# Patient Record
Sex: Male | Born: 1970
Health system: Southern US, Community
[De-identification: ages and names within clinical notes are randomized; demographics above are authoritative.]

---

## 2008-04-08 ENCOUNTER — Emergency Department (HOSPITAL_COMMUNITY): Admission: EM | Admit: 2008-04-08 | Discharge: 2008-04-08 | Payer: Self-pay | Admitting: Emergency Medicine

## 2009-05-08 ENCOUNTER — Emergency Department (HOSPITAL_COMMUNITY): Admission: EM | Admit: 2009-05-08 | Discharge: 2009-05-08 | Payer: Self-pay | Admitting: Family Medicine

## 2009-06-11 ENCOUNTER — Emergency Department (HOSPITAL_COMMUNITY): Admission: EM | Admit: 2009-06-11 | Discharge: 2009-06-11 | Payer: Self-pay | Admitting: Emergency Medicine

## 2010-12-19 LAB — POCT I-STAT, CHEM 8
Calcium, Ion: 1.2 mmol/L (ref 1.12–1.32)
HCT: 47 % (ref 39.0–52.0)
Hemoglobin: 16 g/dL (ref 13.0–17.0)
TCO2: 28 mmol/L (ref 0–100)

## 2010-12-19 LAB — PROTIME-INR: Prothrombin Time: 12.8 seconds (ref 11.6–15.2)

## 2010-12-20 LAB — CULTURE, ROUTINE-ABSCESS

## 2020-09-03 ENCOUNTER — Other Ambulatory Visit: Payer: Self-pay

## 2020-09-03 ENCOUNTER — Emergency Department (HOSPITAL_COMMUNITY)
Admission: EM | Admit: 2020-09-03 | Discharge: 2020-09-04 | Disposition: A | Discharge status: Home / Self Care | Payer: Self-pay | Attending: Emergency Medicine | Admitting: Emergency Medicine

## 2020-09-03 DIAGNOSIS — Y9339 Activity, other involving climbing, rappelling and jumping off: Secondary | ICD-10-CM | POA: Insufficient documentation

## 2020-09-03 DIAGNOSIS — S62327A Displaced fracture of shaft of fifth metacarpal bone, left hand, initial encounter for closed fracture: Secondary | ICD-10-CM | POA: Insufficient documentation

## 2020-09-03 DIAGNOSIS — W268XXA Contact with other sharp object(s), not elsewhere classified, initial encounter: Secondary | ICD-10-CM | POA: Insufficient documentation

## 2020-09-03 DIAGNOSIS — Z23 Encounter for immunization: Secondary | ICD-10-CM | POA: Insufficient documentation

## 2020-09-03 DIAGNOSIS — S51812A Laceration without foreign body of left forearm, initial encounter: Secondary | ICD-10-CM | POA: Insufficient documentation

## 2020-09-03 NOTE — ED Triage Notes (Signed)
Pt with multiple lacs to left forearm, L elbow from attempting to jump over a fence 12 hours ago. Last tetanus unknown.

## 2020-09-04 ENCOUNTER — Other Ambulatory Visit (HOSPITAL_COMMUNITY): Payer: Self-pay | Admitting: Physician Assistant

## 2020-09-04 ENCOUNTER — Emergency Department (HOSPITAL_COMMUNITY): Payer: Self-pay

## 2020-09-04 MED ORDER — CEPHALEXIN 500 MG PO CAPS: 500.0000 mg | ORAL_CAPSULE | Freq: Four times a day (QID) | ORAL | 0 refills | Status: DC

## 2020-09-04 MED ORDER — CEFAZOLIN SODIUM-DEXTROSE 2-4 GM/100ML-% IV SOLN
2.0000 g | Freq: Once | INTRAVENOUS | Status: AC
  Administered 2020-09-04: 2 g via INTRAVENOUS
  Filled 2020-09-04: qty 100

## 2020-09-04 MED ORDER — TETANUS-DIPHTH-ACELL PERTUSSIS 5-2.5-18.5 LF-MCG/0.5 IM SUSY
0.5000 mL | PREFILLED_SYRINGE | Freq: Once | INTRAMUSCULAR | Status: AC
  Administered 2020-09-04: 0.5 mL via INTRAMUSCULAR
  Filled 2020-09-04: qty 0.5

## 2020-09-04 MED ORDER — LIDOCAINE-EPINEPHRINE 1 %-1:100000 IJ SOLN
10.0000 mL | Freq: Once | INTRAMUSCULAR | Status: DC
  Filled 2020-09-04: qty 1

## 2020-09-04 MED FILL — CEPHALEXIN 500 MG CAPS: 500 | 10 days supply | Qty: 40 | Fill #0

## 2020-09-04 NOTE — Consult Note (Signed)
Reason for Consult:Left 5th MC fx Referring Physician: Ruel Favors Time called: 1313 Time at bedside: 1326   Bill Ryan is an 49 y.o. male.  HPI: Haywood was jumping over a chain link fence night before last. He cut his left forearm and hurt his hand. He came to the ED for evaluation and x-rays showed a 5th MC fx and hand surgery was consulted. He is homeless and currently unemployed. He is RHD.  No past medical history on file.  No family history on file.  Social History:  has no history on file for tobacco use, alcohol use, and drug use.  Allergies: No Known Allergies  Medications: I have reviewed the patient's current medications.  No results found for this or any previous visit (from the past 48 hour(s)).  DG Forearm Left  Result Date: 09/04/2020 CLINICAL DATA:  Multiple forearm lacerations after attempting to jump over a fence 12 hours ago. EXAM: LEFT FOREARM - 2 VIEW COMPARISON:  None. FINDINGS: There is no evidence of fracture or other focal bone lesions. Mild soft tissue irregularity along the ulnar aspect of the distal forearm. IMPRESSION: 1. Mild soft tissue irregularity along the ulnar aspect of the distal forearm. No acute osseous abnormality. Electronically Signed   By: Obie Dredge M.D.   On: 09/04/2020 13:02   DG Hand Complete Left  Result Date: 09/04/2020 CLINICAL DATA:  Jumped over a fence 12 hours ago. Fifth metacarpal deformity. EXAM: LEFT HAND - COMPLETE 3+ VIEW COMPARISON:  None. FINDINGS: Acute impacted fracture of the distal fifth metacarpal shaft with volar angulation. Overlying soft tissue swelling. No additional fracture. No dislocation. Joint spaces are preserved. Bone mineralization is normal. IMPRESSION: 1. Acute angulated and impacted fracture of the distal fifth metacarpal. Electronically Signed   By: Obie Dredge M.D.   On: 09/04/2020 13:04    Review of Systems  Constitutional: Negative for chills, diaphoresis and fever.  HENT: Negative for  ear discharge, ear pain, hearing loss and tinnitus.   Eyes: Negative for photophobia and pain.  Respiratory: Negative for cough and shortness of breath.   Cardiovascular: Negative for chest pain.  Gastrointestinal: Negative for abdominal pain, nausea and vomiting.  Genitourinary: Negative for dysuria, flank pain, frequency and urgency.  Musculoskeletal: Positive for arthralgias (Left hand/FA). Negative for back pain, myalgias and neck pain.  Neurological: Negative for dizziness and headaches.  Hematological: Does not bruise/bleed easily.  Psychiatric/Behavioral: The patient is not nervous/anxious.    Blood pressure 113/80, pulse 78, temperature 98.2 F (36.8 C), temperature source Oral, resp. rate 14, height 5\' 9"  (1.753 m), weight 74.4 kg, SpO2 98 %. Physical Exam Constitutional:      General: He is not in acute distress.    Appearance: He is well-developed and well-nourished. He is not diaphoretic.  HENT:     Head: Normocephalic and atraumatic.  Eyes:     General: No scleral icterus.       Right eye: No discharge.        Left eye: No discharge.     Conjunctiva/sclera: Conjunctivae normal.  Cardiovascular:     Rate and Rhythm: Normal rate and regular rhythm.  Pulmonary:     Effort: Pulmonary effort is normal. No respiratory distress.  Musculoskeletal:     Cervical back: Normal range of motion.     Comments: Left shoulder, elbow, wrist, digits- superficial lacerations volar FA, mod TTP 5th distal MC, no instability, no blocks to motion  Sens  Ax/R/M/U intact  Mot   Ax/  R/ PIN/ M/ AIN/ U intact  Rad 2+  Skin:    General: Skin is warm and dry.  Neurological:     Mental Status: He is alert.  Psychiatric:        Mood and Affect: Mood and affect normal.        Behavior: Behavior normal.     Assessment/Plan: Left 5th MC fx -- Plan buddy tape, removable volar splint and f/u with Dr. Janee Morn. Advise leaving wounds open for drainage and wound care.    Freeman Caldron,  PA-C Orthopedic Surgery [phone] 09/04/2020, 1:43 PM

## 2020-09-04 NOTE — Discharge Planning (Signed)
RNCM consulted regarding medication assistance of pt unable to afford medications.  RNCM utilized [facility] petty cash to cover $6 co-pay and filled through [facility].  Rx e-scribed to [facility] and delivered to pt prior to discharge home.  No further RNCM needs identified at this time.

## 2020-09-04 NOTE — Discharge Instructions (Addendum)
Like we discussed, I am prescribing you an antibiotic called Keflex.  You are going to take this 4 times a day for the next 10 days.  Do not stop taking this early.  Dr. Janee Morn with hand surgery is going to contact you to make an appointment for follow-up in 10 to 15 days.  Please make sure you go to this appointment.  If your symptoms worsen, you need to return to the emergency department for reevaluation.  Please also make sure you are keeping your wound clean and dry.  Continue to clean it 2 times a day with soap and water. Remove your splint while you are bathing but otherwise wear your splint.

## 2020-09-04 NOTE — ED Provider Notes (Signed)
[name] EMERGENCY DEPARTMENT Provider Note   CSN: [id number] Arrival date & time: 09/03/20  1734     History Chief Complaint  Patient presents with  . Laceration    Bill Ryan is a 49 y.o. male.  HPI   Patient is a 49 year old male who presents to the emergency department due to multiple lacerations to left forearm.  Patient states he was jumping over a fence and his forearm was caught on the metal barbs of the fence.  Unsure of the timing of his last tetanus.  Notes moderate pain in the region.  Also reports pain and swelling just proximal to the left fifth MCP.  No head trauma or LOC.  No numbness or weakness.     No past medical history on file.  There are no problems to display for this patient.    No family history on file.     Home Medications Prior to Admission medications   Not on File    Allergies    Patient has no known allergies.  Review of Systems   Review of Systems  Musculoskeletal: Positive for myalgias.  Skin: Positive for wound. Negative for color change.  Neurological: Negative for weakness and numbness.   Physical Exam Updated Vital Signs BP 107/82 (BP Location: Left Arm)   Pulse 94   Temp 98.2 F (36.8 C) (Oral)   Resp 16   Ht 5\' 9"  (1.753 m)   Wt 74.4 kg   SpO2 100%   BMI 24.22 kg/m   Physical Exam Vitals and nursing note reviewed.  Constitutional:      General: He is not in acute distress.    Appearance: He is well-developed.  HENT:     Head: Normocephalic and atraumatic.     Right Ear: External ear normal.     Left Ear: External ear normal.  Eyes:     General: No scleral icterus.       Right eye: No discharge.        Left eye: No discharge.     Conjunctiva/sclera: Conjunctivae normal.  Neck:     Trachea: No tracheal deviation.  Cardiovascular:     Rate and Rhythm: Normal rate.  Pulmonary:     Effort: Pulmonary effort is normal. No respiratory distress.     Breath sounds: No stridor.   Abdominal:     General: There is no distension.  Musculoskeletal:        General: Tenderness, deformity and signs of injury present. No swelling.     Cervical back: Neck supple.     Comments: Deformity and point of moderate tenderness and swelling just proximal to the left fifth MCP.  Full range of motion of all the fingers of the left hand.  Skin:    General: Skin is warm and dry.     Findings: No rash.     Comments: 2 moderate avulsion lacerations noted to the left forearm.  No active bleeding. Distal sensation intact.  Full range of motion of the left elbow, left wrist, as well as all 5 fingers of the left hand.  Neurological:     Mental Status: He is alert.     Cranial Nerves: Cranial nerve deficit: no gross deficits.    ED Results / Procedures / Treatments   Labs (all labs ordered are listed, but only abnormal results are displayed) Labs Reviewed - No data to display  EKG None  Radiology DG Forearm Left  Result Date: 09/04/2020 CLINICAL DATA:  Multiple forearm lacerations after attempting to jump over a fence 12 hours ago. EXAM: LEFT FOREARM - 2 VIEW COMPARISON:  None. FINDINGS: There is no evidence of fracture or other focal bone lesions. Mild soft tissue irregularity along the ulnar aspect of the distal forearm. IMPRESSION: 1. Mild soft tissue irregularity along the ulnar aspect of the distal forearm. No acute osseous abnormality. Electronically Signed   By: Obie Dredge M.D.   On: 09/04/2020 13:02   DG Hand Complete Left  Result Date: 09/04/2020 CLINICAL DATA:  Jumped over a fence 12 hours ago. Fifth metacarpal deformity. EXAM: LEFT HAND - COMPLETE 3+ VIEW COMPARISON:  None. FINDINGS: Acute impacted fracture of the distal fifth metacarpal shaft with volar angulation. Overlying soft tissue swelling. No additional fracture. No dislocation. Joint spaces are preserved. Bone mineralization is normal. IMPRESSION: 1. Acute angulated and impacted fracture of the distal fifth  metacarpal. Electronically Signed   By: Obie Dredge M.D.   On: 09/04/2020 13:04    Procedures Procedures (including critical care time)  Medications Ordered in ED Medications  lidocaine-EPINEPHrine (XYLOCAINE W/EPI) 1 %-1:100000 (with pres) injection 10 mL (10 mLs Infiltration Handoff 09/04/20 1330)  Tdap (BOOSTRIX) injection 0.5 mL (0.5 mLs Intramuscular Given 09/04/20 1404)  ceFAZolin (ANCEF) IVPB 2g/100 mL premix (2 g Intravenous New Bag/Given 09/04/20 1428)   ED Course  I have reviewed the triage vital signs and the nursing notes.  Pertinent labs & imaging results that were available during my care of the patient were reviewed by me and considered in my medical decision making (see chart for details).  Clinical Course as of 09/04/20 1532  Wed Sep 04, 2020  1340 Patient discussed with and evaluated by Charma Igo PA-C.  Patient also discussed with Dr. Janee Morn.  Does not recommend surgery at this time.  Recommends 2 g of Ancef, removable volar splint, cleaning wounds on the forearm and not suturing, as well as buddy taping the fourth and fifth digits together of the left hand.  Patient is right-hand dominant.  Dr. Carollee Massed office will reach out to the patient for follow-up.  Patient is amenable with this plan. [LJ]    Clinical Course User Index [LJ] Placido Sou, PA-C   MDM Rules/Calculators/A&P                          Patient is a 49 year old male who presents to the emergency department with multiple avulsion lacerations to the left forearm as well as a acute angulated and impacted fracture of the distal fifth metacarpal.  Patient discussed with and evaluated by Charma Igo, PA-C.  He then discussed patient with Dr. Janee Morn, who is on-call for hand surgery.  Dr. Janee Morn does not recommend surgery at this time.  Requested that we give patient 2 g of IV Ancef, place him in a removable volar splint, as well as buddy tape the fourth and fifth digits of the left  hand.  He is right-hand dominant.  Given the length of time since his lacerations occurred, did not recommend moving forward with sutures on the forearm.  The wounds were cleaned extensively and bandaged.  Tdap updated in the ED.  Patient states that he experiences intermittent homelessness and currently has no funds for antibiotics.  He was discussed with TOC who provided him with 10 days of Keflex 4 times daily.    Dr. Janee Morn is going to reach out to the patient in 10 to 15 days for follow-up.  This  was discussed with patient in length and he is amenable with this plan.  We discussed wound care in length.  His questions were answered and he was amicable at the time of discharge.  Final Clinical Impression(s) / ED Diagnoses Final diagnoses:  Laceration of left forearm, initial encounter  Closed displaced fracture of shaft of fifth metacarpal bone of left hand, initial encounter    Rx / DC Orders ED Discharge Orders         Ordered    cephALEXin (KEFLEX) 500 MG capsule  4 times daily        09/04/20 1246           Placido Sou, PA-C 09/04/20 1533    Benjiman Core, MD 09/04/20 1620

## 2020-09-04 NOTE — Progress Notes (Signed)
Orthopedic Tech Progress Note Patient Details:  Bill Ryan 03-17-1971 [id number]  Ortho Devices Type of Ortho Device: Velcro wrist splint Ortho Device/Splint Location: LUE Ortho Device/Splint Interventions: Ordered,Application   Post Interventions Patient Tolerated: Well Instructions Provided: Care of device   [name] 09/04/2020, 4:15 PM

## 2020-09-04 NOTE — Progress Notes (Signed)
CSW added shelter resources to the patient's AVS for reference.  Edwin Dada, MSW, LCSW-A Transitions of Care  Clinical Social Worker [facility] Emergency Departments  Medical ICU [phone]

## 2020-09-04 NOTE — ED Notes (Addendum)
Ortho notified

## 2020-09-04 NOTE — ED Notes (Signed)
Pt given the antibiotics provided by this hospital.

## 2020-09-16 ENCOUNTER — Other Ambulatory Visit: Payer: Self-pay

## 2020-10-17 ENCOUNTER — Emergency Department (HOSPITAL_COMMUNITY)
Admission: EM | Admit: 2020-10-17 | Discharge: 2020-10-18 | Disposition: A | Discharge status: Home / Self Care | Payer: Self-pay | Attending: Emergency Medicine | Admitting: Emergency Medicine

## 2020-10-17 DIAGNOSIS — X501XXA Overexertion from prolonged static or awkward postures, initial encounter: Secondary | ICD-10-CM | POA: Insufficient documentation

## 2020-10-17 DIAGNOSIS — M25561 Pain in right knee: Secondary | ICD-10-CM | POA: Insufficient documentation

## 2020-10-17 DIAGNOSIS — M25572 Pain in left ankle and joints of left foot: Secondary | ICD-10-CM | POA: Insufficient documentation

## 2020-10-17 DIAGNOSIS — Y9302 Activity, running: Secondary | ICD-10-CM | POA: Insufficient documentation

## 2020-10-17 DIAGNOSIS — F17200 Nicotine dependence, unspecified, uncomplicated: Secondary | ICD-10-CM | POA: Insufficient documentation

## 2020-10-18 ENCOUNTER — Encounter (HOSPITAL_COMMUNITY): Payer: Self-pay | Admitting: Emergency Medicine

## 2020-10-18 ENCOUNTER — Emergency Department (HOSPITAL_COMMUNITY): Payer: Self-pay

## 2020-10-18 ENCOUNTER — Other Ambulatory Visit: Payer: Self-pay

## 2020-10-18 MED ORDER — NAPROXEN 250 MG PO TABS
500.0000 mg | ORAL_TABLET | Freq: Once | ORAL | Status: AC
  Administered 2020-10-18: 500 mg via ORAL
  Filled 2020-10-18: qty 2

## 2020-10-18 NOTE — Progress Notes (Signed)
Orthopedic Tech Progress Note Patient Details:  Bill Ryan [birth year] [id number]  Ortho Devices Type of Ortho Device: ASO Ortho Device/Splint Location: Left Ankle Ortho Device/Splint Interventions: Application   Post Interventions Patient Tolerated: Bill Ryan 10/18/2020, 1:28 AM

## 2020-10-18 NOTE — Discharge Instructions (Signed)
Apply ice to areas of injury 3-4 times per day to limit inflammation. Take 600mg  ibuprofen every 6 hours for pain and swelling. Wear a brace for stability. We recommend follow-up with a primary care doctor to ensure resolution of symptoms.  Return to the ED for any new or concerning symptoms.

## 2020-10-18 NOTE — ED Triage Notes (Signed)
Patient arrived with 2 [employer] officers handcuffed reports pain at right knee and left ankle injured while he was being arrested this evening , ambulatory /respirations unlabored , alert and oriented .

## 2020-10-18 NOTE — ED Provider Notes (Signed)
[facility] EMERGENCY DEPARTMENT Provider Note   CSN: [id number] Arrival date & time: 10/17/20  2347     History Chief Complaint  Patient presents with  . Knee Pain / Ankle Pain     [employer] handcuffed    Bill Ryan is a 50 y.o. male.  50 year old male presents to the emergency department with 2 [employer] officers.  Reports pain in his left ankle and right knee which occurred while evading the police.  Pain has remained unchanged.  States that he twisted his left ankle while running.  Has been having constant pain which is aggravated with weightbearing.  He has not had any inability to ambulate.  No medications taken prior to arrival for pain.  Denies numbness or prior injury to the extremities.       History reviewed. No pertinent past medical history.  There are no problems to display for this patient.   History reviewed. No pertinent surgical history.     No family history on file.  Social History   Tobacco Use  . Smoking status: Current Every Day Smoker  . Smokeless tobacco: Never Used  Substance Use Topics  . Alcohol use: Never  . Drug use: Never    Home Medications Prior to Admission medications   Not on File    Allergies    Patient has no known allergies.  Review of Systems   Review of Systems  Ten systems reviewed and are negative for acute change, except as noted in the HPI.    Physical Exam Updated Vital Signs BP 121/86 (BP Location: Right Arm)   Pulse 100   Temp 98.9 F (37.2 C) (Oral)   Resp 16   Ht 5\' 9"  (1.753 m)   Wt 82 kg   SpO2 97%   BMI 26.70 kg/m   Physical Exam Vitals and nursing note reviewed.  Constitutional:      General: He is not in acute distress.    Appearance: He is well-developed and well-nourished. He is not diaphoretic.     Comments: Nontoxic appearing and in NAD  HENT:     Head: Normocephalic and atraumatic.  Eyes:     General: No scleral icterus.    Extraocular Movements: EOM normal.      Conjunctiva/sclera: Conjunctivae normal.  Cardiovascular:     Rate and Rhythm: Normal rate and regular rhythm.     Pulses: Normal pulses.     Comments: DP pulse 2+ b/l Pulmonary:     Effort: Pulmonary effort is normal. No respiratory distress.  Musculoskeletal:        General: Normal range of motion.     Cervical back: Normal range of motion.     Right knee: No swelling, deformity, erythema or bony tenderness. Normal range of motion. Normal alignment.     Left ankle: No swelling or deformity. Normal range of motion.     Left Achilles Tendon: Tenderness present. Thompson's test negative.  Skin:    General: Skin is warm and dry.     Coloration: Skin is not pale.     Findings: No erythema or rash.  Neurological:     Mental Status: He is alert and oriented to person, place, and time.     Coordination: Coordination normal.     Comments: Sensation to light touch intact in all extremities. Ambulatory with steady gait without assistance.  Psychiatric:        Mood and Affect: Mood and affect normal.  Behavior: Behavior normal.     ED Results / Procedures / Treatments   Labs (all labs ordered are listed, but only abnormal results are displayed) Labs Reviewed - No data to display  EKG None  Radiology DG Ankle Complete Left  Result Date: 10/18/2020 CLINICAL DATA:  Injury and pain EXAM: LEFT ANKLE COMPLETE - 3+ VIEW COMPARISON:  None. FINDINGS: There is no evidence of fracture, dislocation, or joint effusion. There is no evidence of arthropathy or other focal bone abnormality. Soft tissues are unremarkable. IMPRESSION: Negative. Electronically Signed   By: Jonna Clark M.D.   On: 10/18/2020 00:47   DG Knee Complete 4 Views Right  Result Date: 10/18/2020 CLINICAL DATA:  Injury and pain EXAM: RIGHT KNEE - COMPLETE 4+ VIEW COMPARISON:  None. FINDINGS: No evidence of fracture, dislocation, or joint effusion. No evidence of arthropathy or other focal bone abnormality. Soft tissues are  unremarkable. IMPRESSION: Negative. Electronically Signed   By: Jonna Clark M.D.   On: 10/18/2020 00:47    Procedures Procedures   Medications Ordered in ED Medications  naproxen (NAPROSYN) tablet 500 mg (has no administration in time range)    ED Course  I have reviewed the triage vital signs and the nursing notes.  Pertinent labs & imaging results that were available during my care of the patient were reviewed by me and considered in my medical decision making (see chart for details).    MDM Rules/Calculators/A&P                          Patient presents to the emergency department for evaluation of L ankle and R knee pain. Patient neurovascularly intact on exam. Imaging negative for fracture, dislocation, bony deformity. No swelling, erythema, heat to touch to the affected area; no concern for septic joint. Compartments in the affected extremity are soft. Plan for supportive management including RICE and NSAIDs; primary care follow up as needed. Return precautions discussed and provided. Patient discharged in stable condition with no unaddressed concerns.   Final Clinical Impression(s) / ED Diagnoses Final diagnoses:  Acute left ankle pain  Acute pain of right knee    Rx / DC Orders ED Discharge Orders    None       Antony Madura, PA-C 10/18/20 0126    Geoffery Lyons, MD 10/18/20 [phone]

## 2021-10-08 IMAGING — DX DG KNEE COMPLETE 4+V*R*
4 series · 4 of 4 positions shown · non-contrast
Comparison: None.

CLINICAL DATA: Injury and pain

EXAM:
RIGHT KNEE - COMPLETE 4+ VIEW

[knee ap]
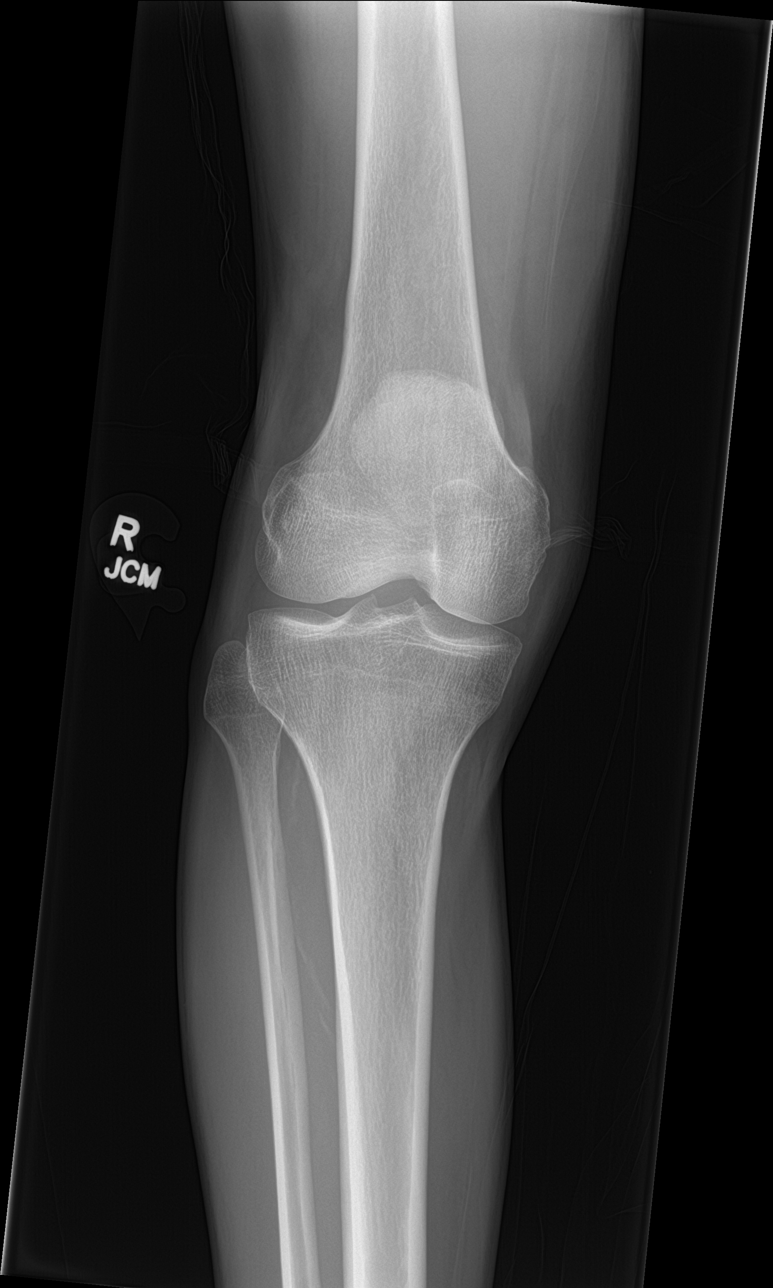

[knee lat]
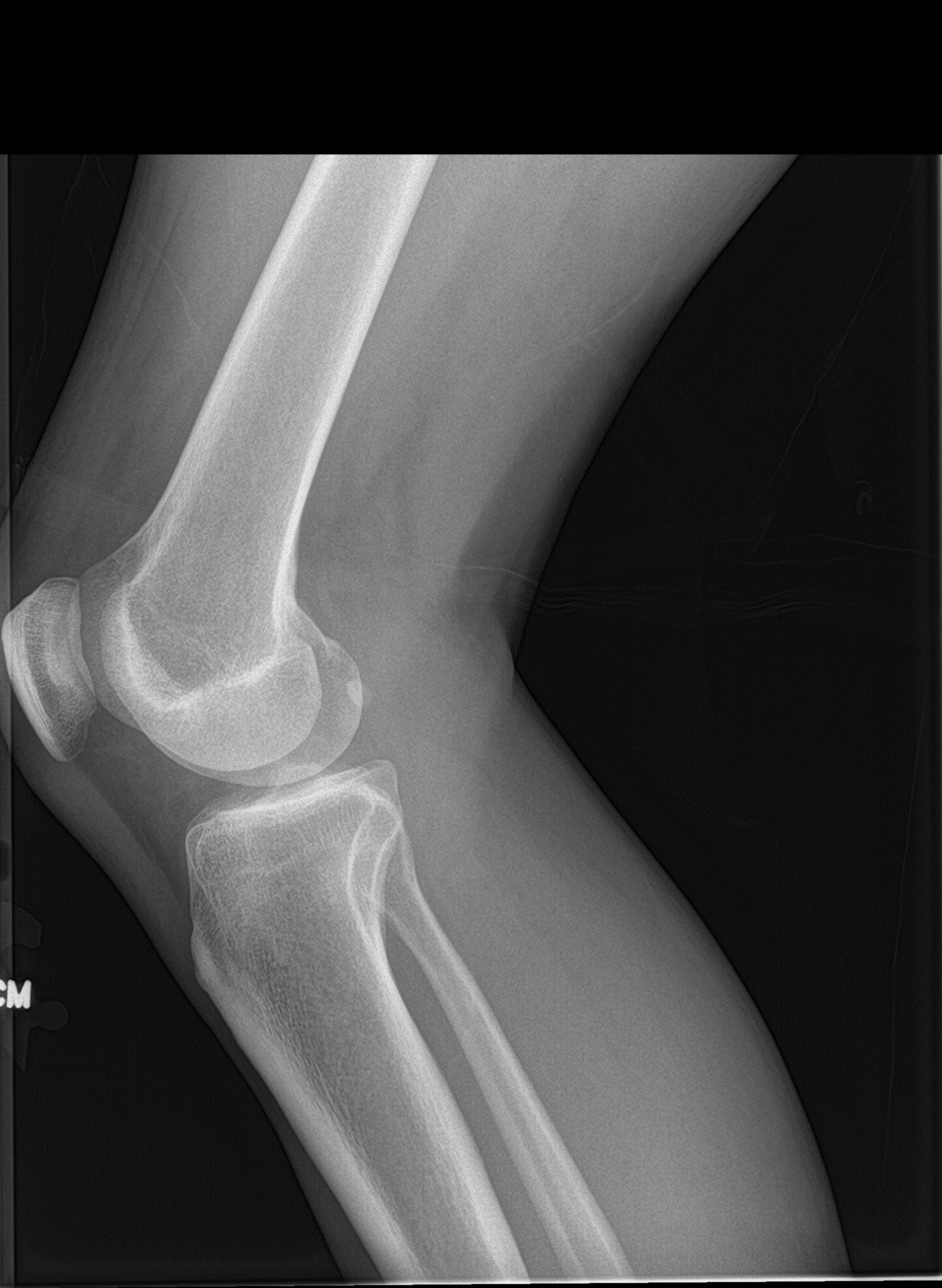

[knee obl (1 of 2)]
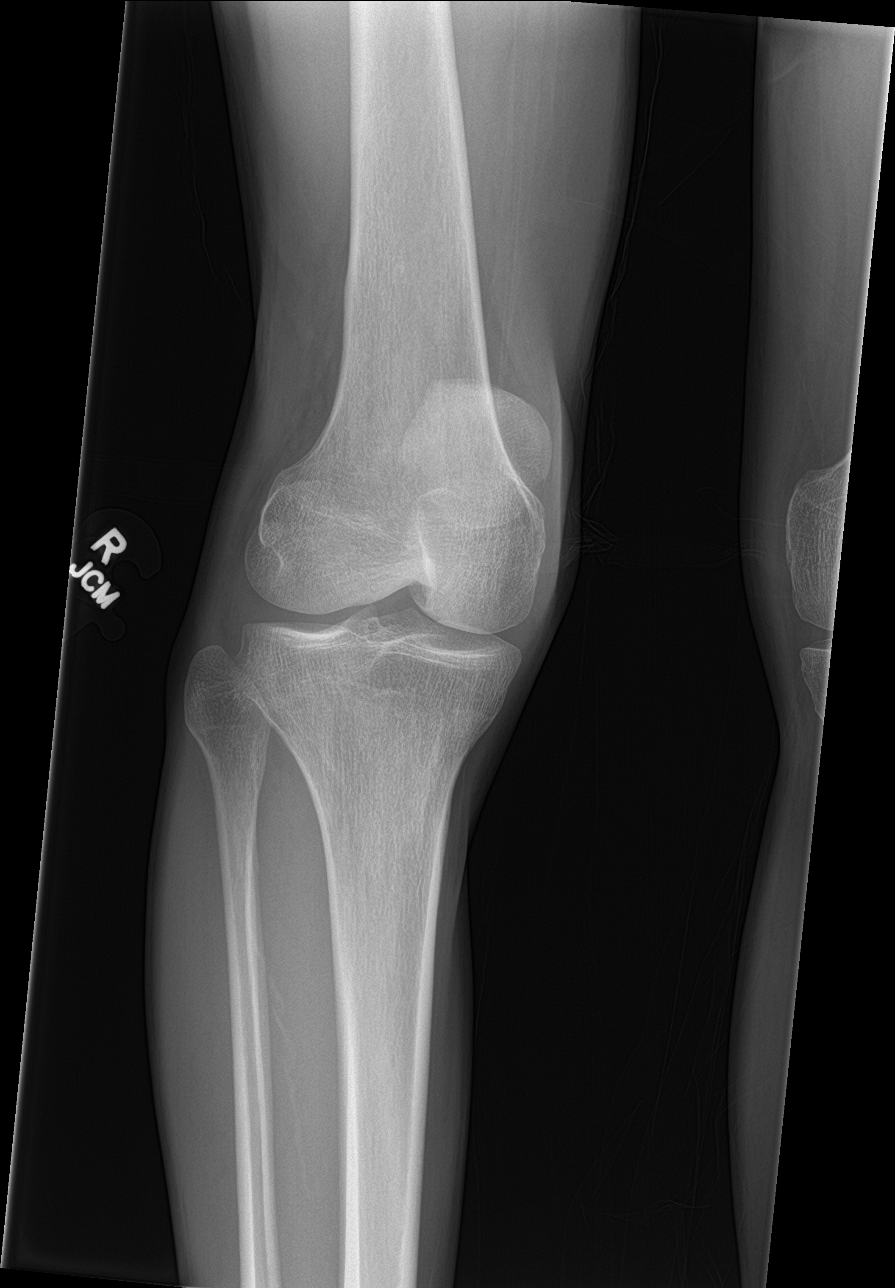

[knee obl (2 of 2)]
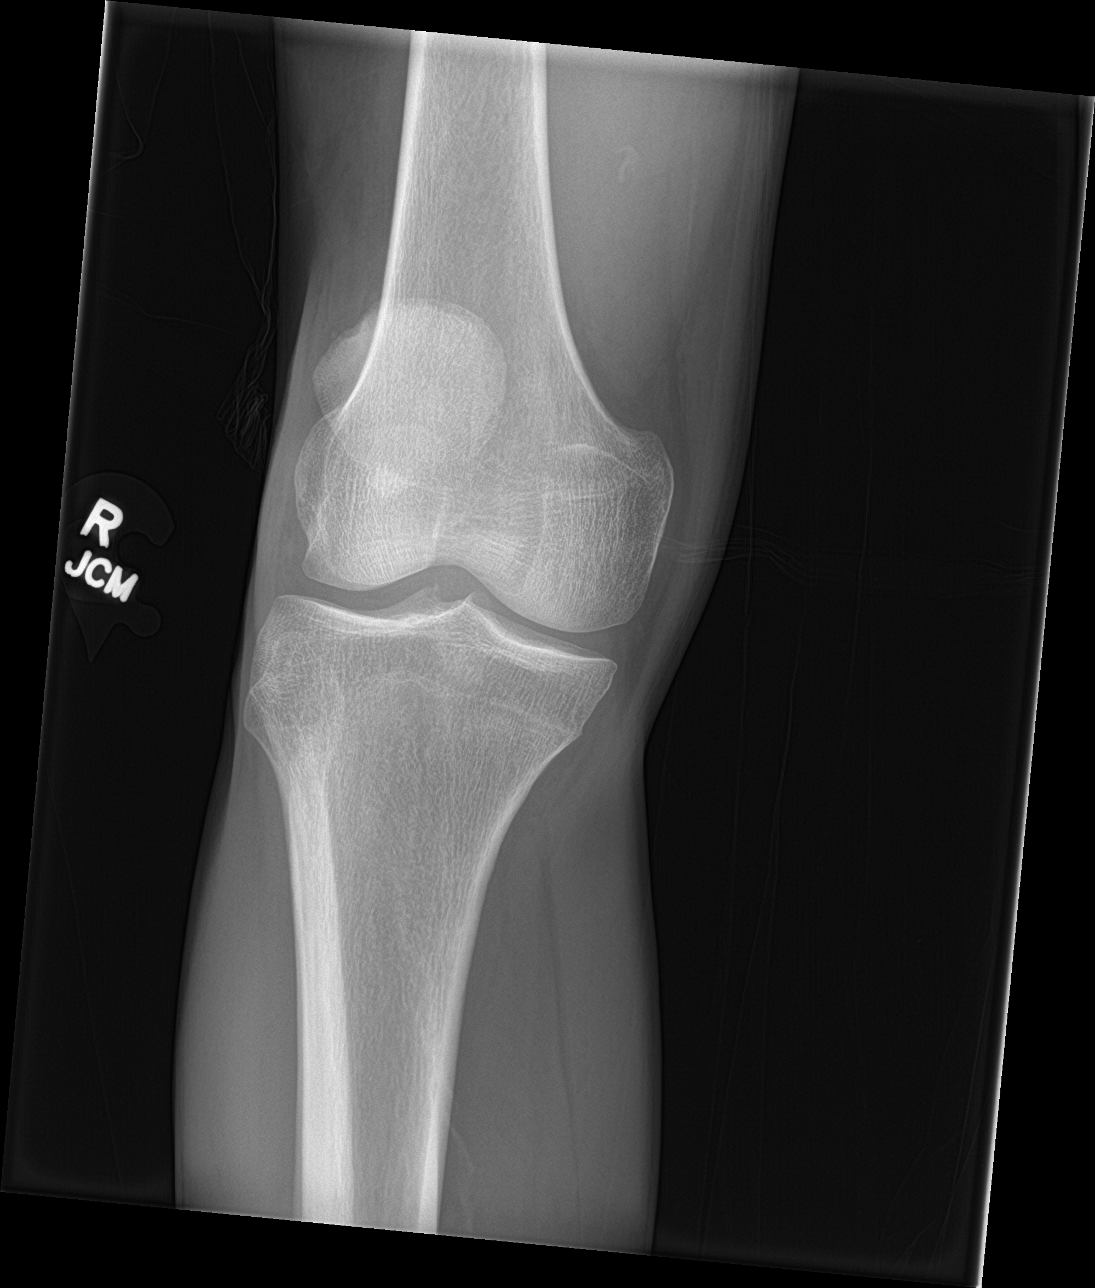

[4 of 4 positions shown; findings below may reference images not displayed]

FINDINGS: No evidence of fracture, dislocation, or joint effusion. No evidence
of arthropathy or other focal bone abnormality. Soft tissues are
unremarkable.
IMPRESSION: Negative.

## 2021-10-08 IMAGING — DX DG ANKLE COMPLETE 3+V*L*
3 series · 3 of 3 positions shown · non-contrast
Comparison: None.

CLINICAL DATA: Injury and pain

EXAM:
LEFT ANKLE COMPLETE - 3+ VIEW

[ankle ap]
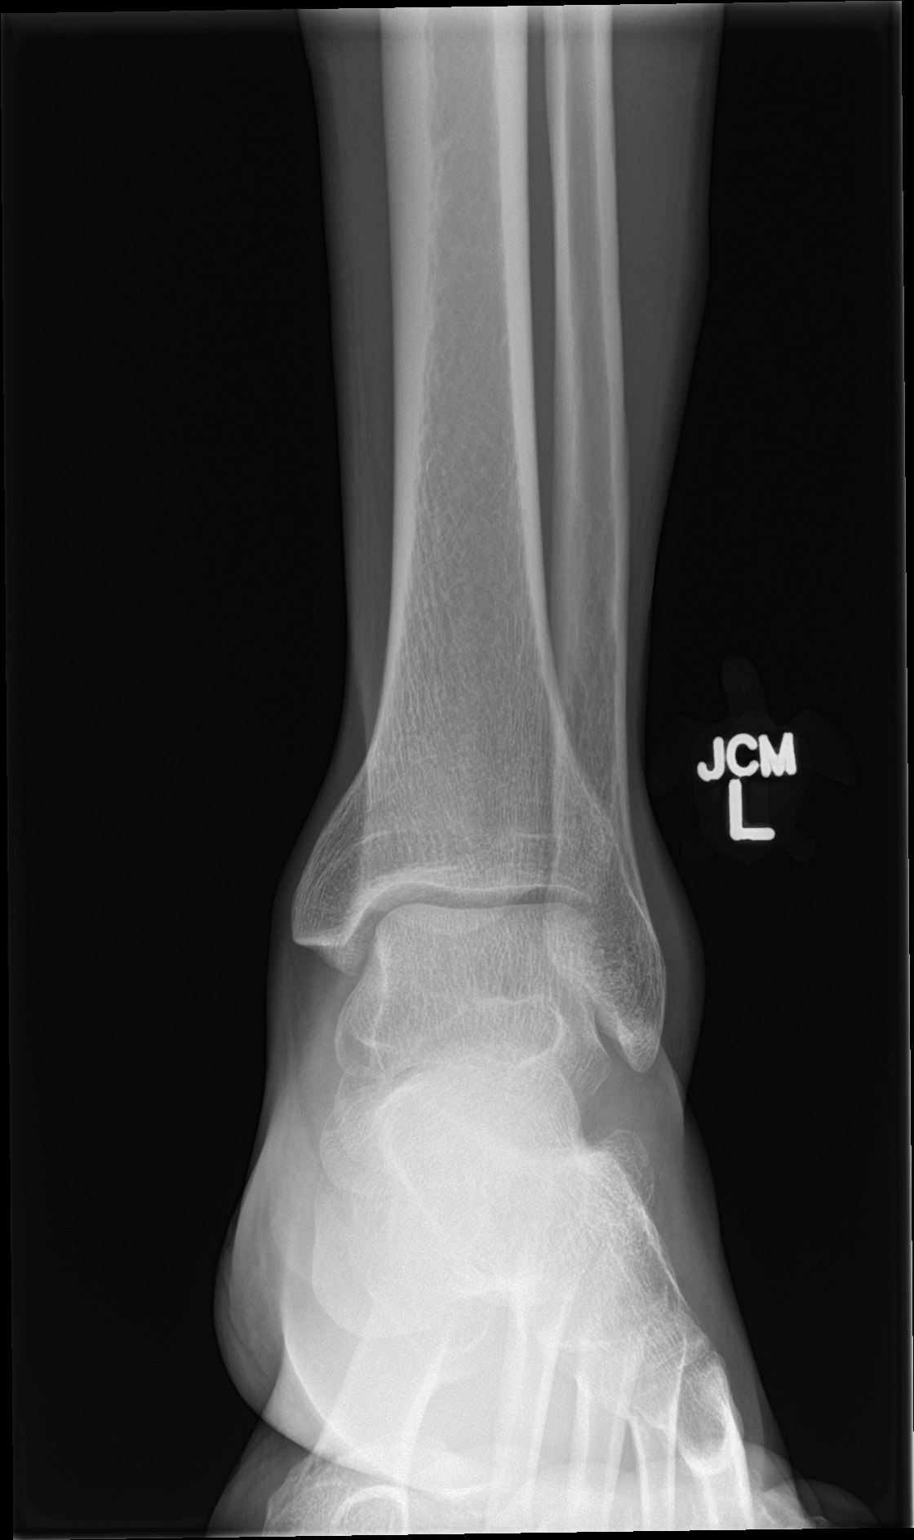

[ankle obl]
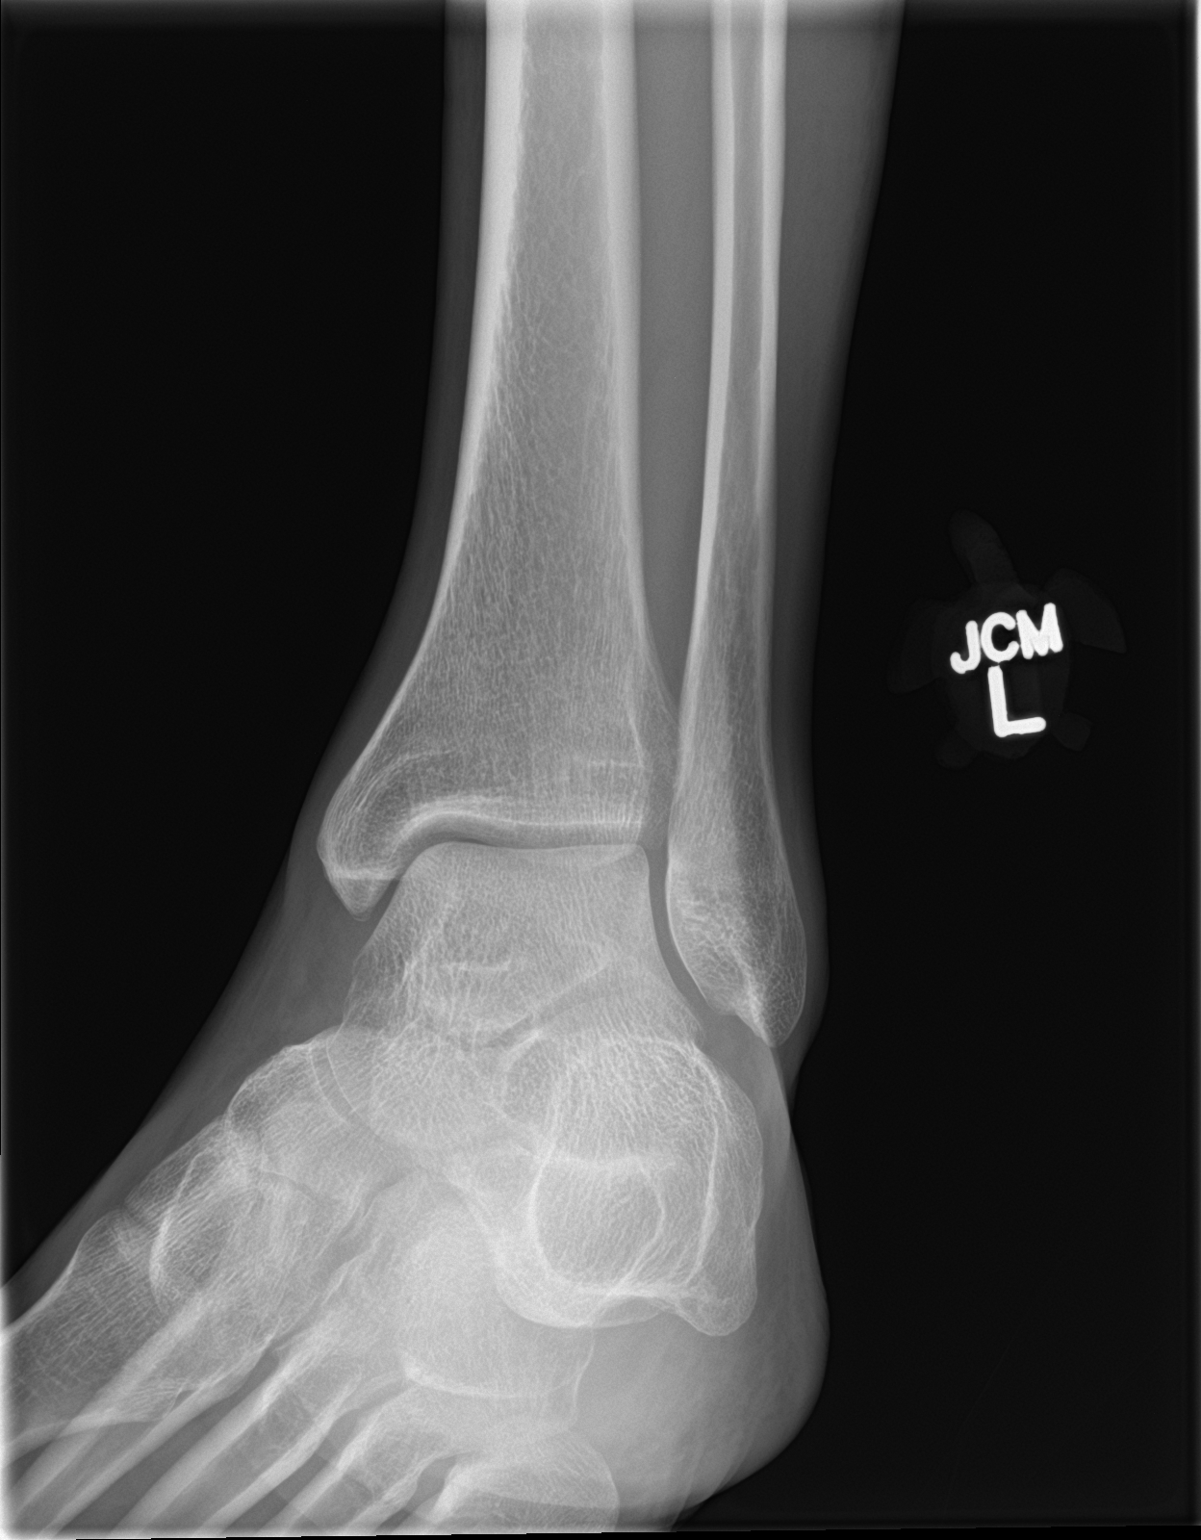

[ankle lat]
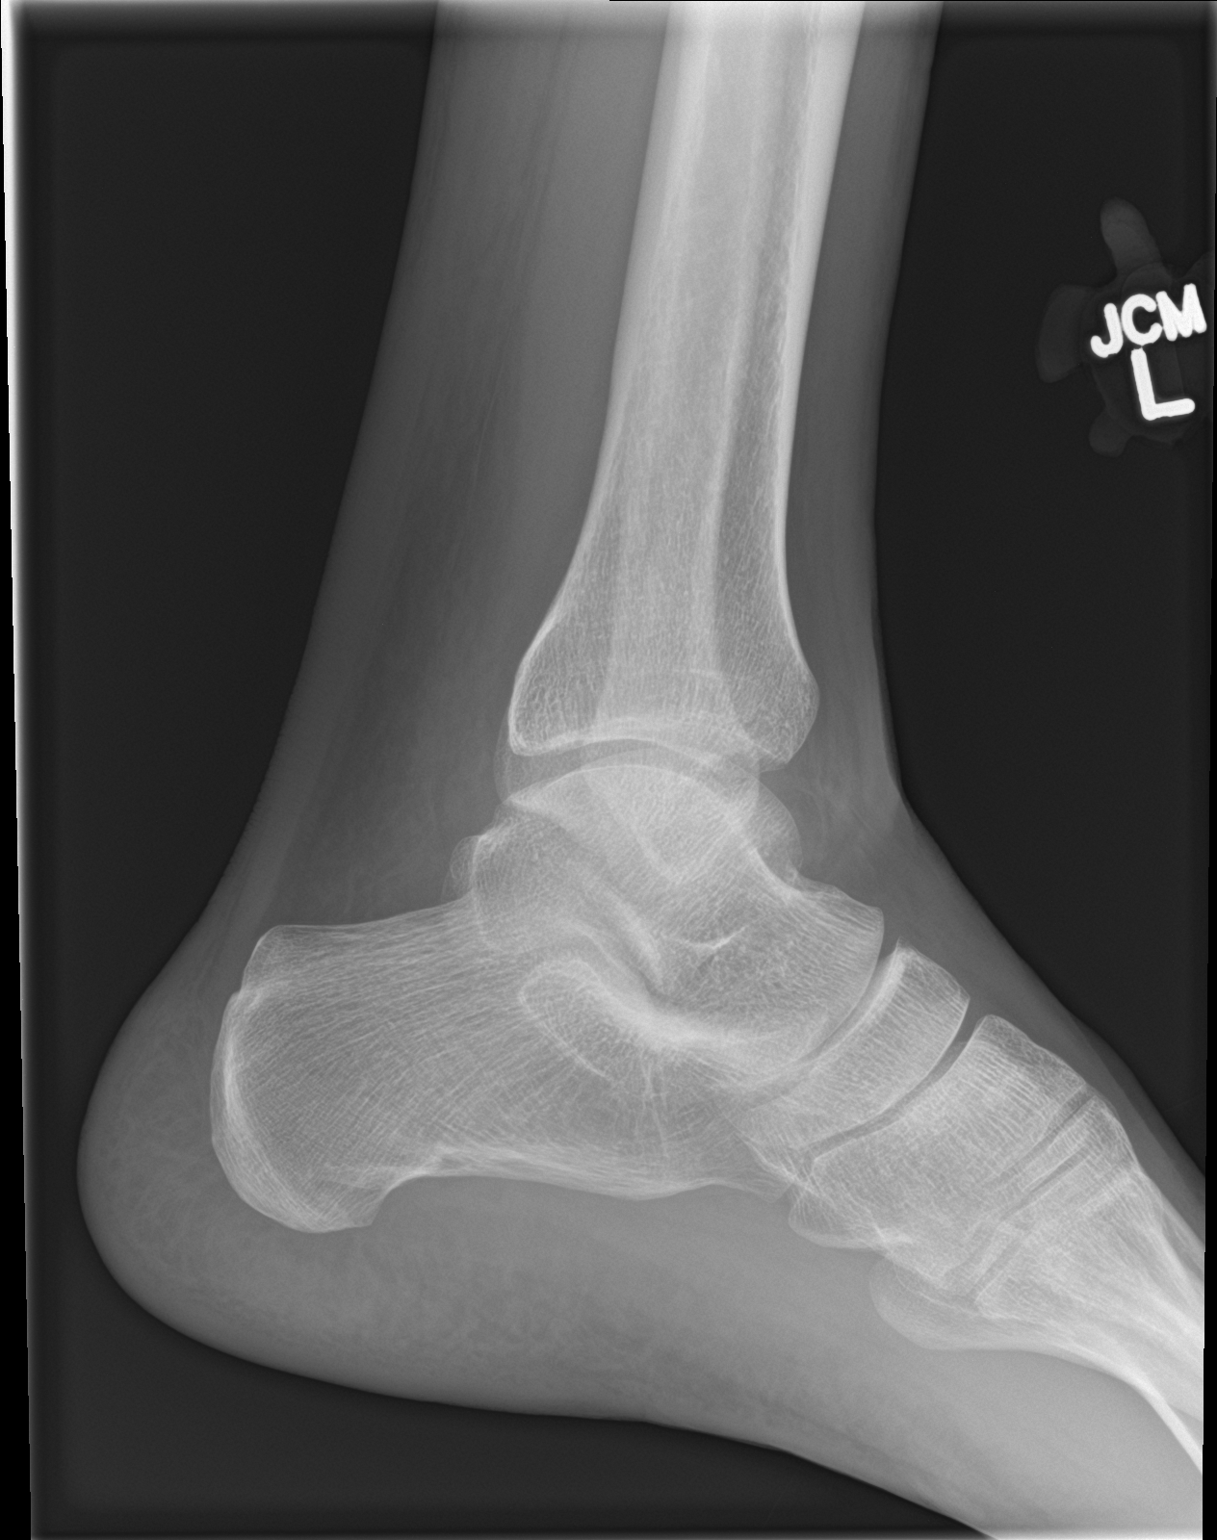

[3 of 3 positions shown; findings below may reference images not displayed]

FINDINGS: There is no evidence of fracture, dislocation, or joint effusion.
There is no evidence of arthropathy or other focal bone abnormality.
Soft tissues are unremarkable.
IMPRESSION: Negative.

## 2022-06-10 ENCOUNTER — Other Ambulatory Visit: Payer: Self-pay
